# Patient Record
Sex: Male | Born: 1965 | Race: Black or African American | Hispanic: No | Marital: Single | Smoking: Never smoker
Health system: Southern US, Community
[De-identification: ages and names within clinical notes are randomized; demographics above are authoritative.]

## PROBLEM LIST (undated history)

## (undated) DIAGNOSIS — I10 Essential (primary) hypertension: Secondary | ICD-10-CM

---

## 2014-07-20 ENCOUNTER — Other Ambulatory Visit: Payer: Self-pay | Admitting: Otolaryngology

## 2014-07-20 DIAGNOSIS — Q892 Congenital malformations of other endocrine glands: Secondary | ICD-10-CM

## 2014-07-27 ENCOUNTER — Ambulatory Visit
Admission: RE | Admit: 2014-07-27 | Discharge: 2014-07-27 | Disposition: A | Discharge status: Home / Self Care | Payer: No Typology Code available for payment source | Source: Ambulatory Visit | Attending: Otolaryngology | Admitting: Otolaryngology

## 2014-07-27 ENCOUNTER — Encounter (INDEPENDENT_AMBULATORY_CARE_PROVIDER_SITE_OTHER): Payer: Self-pay

## 2014-07-27 DIAGNOSIS — Q892 Congenital malformations of other endocrine glands: Secondary | ICD-10-CM

## 2014-07-30 ENCOUNTER — Other Ambulatory Visit: Payer: Self-pay | Admitting: Otolaryngology

## 2014-07-30 DIAGNOSIS — Q892 Congenital malformations of other endocrine glands: Secondary | ICD-10-CM

## 2014-07-31 ENCOUNTER — Ambulatory Visit
Admission: RE | Admit: 2014-07-31 | Discharge: 2014-07-31 | Disposition: A | Discharge status: Home / Self Care | Payer: Self-pay | Source: Ambulatory Visit | Attending: Otolaryngology | Admitting: Otolaryngology

## 2014-07-31 DIAGNOSIS — Q892 Congenital malformations of other endocrine glands: Secondary | ICD-10-CM

## 2014-08-14 ENCOUNTER — Other Ambulatory Visit: Payer: Self-pay | Admitting: Otolaryngology

## 2014-08-15 ENCOUNTER — Inpatient Hospital Stay (HOSPITAL_COMMUNITY)
Admission: EM | Admit: 2014-08-15 | Discharge: 2014-08-17 | DRG: 206 | Disposition: A | Discharge status: Home / Self Care | Payer: No Typology Code available for payment source | Attending: Otolaryngology | Admitting: Otolaryngology

## 2014-08-15 ENCOUNTER — Encounter (HOSPITAL_COMMUNITY): Payer: Self-pay | Admitting: Emergency Medicine

## 2014-08-15 DIAGNOSIS — S1093XA Contusion of unspecified part of neck, initial encounter: Secondary | ICD-10-CM

## 2014-08-15 DIAGNOSIS — J955 Postprocedural subglottic stenosis: Secondary | ICD-10-CM | POA: Diagnosis not present

## 2014-08-15 DIAGNOSIS — I10 Essential (primary) hypertension: Secondary | ICD-10-CM | POA: Diagnosis present

## 2014-08-15 DIAGNOSIS — J384 Edema of larynx: Secondary | ICD-10-CM | POA: Diagnosis not present

## 2014-08-15 HISTORY — DX: Essential (primary) hypertension: I10

## 2014-08-15 LAB — CBC WITH DIFFERENTIAL/PLATELET
Basophils Absolute: 0 10*3/uL (ref 0.0–0.1)
Basophils Relative: 0 % (ref 0–1)
EOS ABS: 0.1 10*3/uL (ref 0.0–0.7)
Eosinophils Relative: 1 % (ref 0–5)
HCT: 38.6 % — ABNORMAL LOW (ref 39.0–52.0)
HEMOGLOBIN: 12.8 g/dL — AB (ref 13.0–17.0)
Lymphocytes Relative: 19 % (ref 12–46)
Lymphs Abs: 1.7 10*3/uL (ref 0.7–4.0)
MCH: 27.6 pg (ref 26.0–34.0)
MCHC: 33.2 g/dL (ref 30.0–36.0)
MCV: 83.2 fL (ref 78.0–100.0)
MONOS PCT: 8 % (ref 3–12)
Monocytes Absolute: 0.7 10*3/uL (ref 0.1–1.0)
NEUTROS ABS: 6.6 10*3/uL (ref 1.7–7.7)
NEUTROS PCT: 72 % (ref 43–77)
Platelets: 208 10*3/uL (ref 150–400)
RBC: 4.64 MIL/uL (ref 4.22–5.81)
RDW: 14.6 % (ref 11.5–15.5)
WBC: 9.1 10*3/uL (ref 4.0–10.5)

## 2014-08-15 MED ORDER — ONDANSETRON 4 MG PO TBDP
8.0000 mg | ORAL_TABLET | Freq: Once | ORAL | Status: AC
  Administered 2014-08-15: 8 mg via ORAL
  Filled 2014-08-15: qty 2

## 2014-08-15 MED ORDER — OXYCODONE-ACETAMINOPHEN 5-325 MG PO TABS
1.0000 | ORAL_TABLET | Freq: Once | ORAL | Status: AC
  Administered 2014-08-15: 1 via ORAL
  Filled 2014-08-15: qty 1

## 2014-08-15 NOTE — ED Notes (Signed)
Dr. Ward at bedside.

## 2014-08-15 NOTE — ED Notes (Signed)
Last  coldeine was around 1800

## 2014-08-15 NOTE — Procedures (Signed)
Preop diagnosis: Throat pain, dysphagia s/p removal of thyroglossal duct cyst Postop diagnosis: same, laryngeal edema Procedure: Transnasal fiberoptic laryngoscopy Surgeon: Jenne PaneBates Anesth: Viscous lidocaine Comp: None Findings: Tongue base position is normal.  Epiglottis is normal.  Supraglottic edema, worse on the right, with hooding of glottis.  Vocal folds normal with normal mobility. Description:  After discussing risks, benefits, and alternatives, the fiberoptic laryngoscope was passed through the nasal passage coated in viscous lidocaine.  The scope was used to evaluate the pharynx and larynx and was then removed.  He was returned to nursing care in stable condition.

## 2014-08-15 NOTE — ED Notes (Signed)
caLL PLACED  TO DR Jenne PaneBATES

## 2014-08-15 NOTE — ED Notes (Addendum)
Pt here for difficulty swallowing, unable to eat and drink after having thyroid surgery yesterday. Dr. Jenne PaneBates aware pt here and is expecting him. Pt not having pain when he is not swallowing, but rates pain 9-10/10 when he swallows. Pt also has a fever.

## 2014-08-15 NOTE — ED Provider Notes (Signed)
TIME SEEN: 9:54 PM  CHIEF COMPLAINT: Neck swelling, difficulty swallowing, fever  HPI: Patient is a 48 y.o. M with history of hypertension who recently had a thyroglossal duct cyst removed with Dr. Jearld FentonByers with ENT yesterday who presents to the emergency department with neck swelling, redness, tenderness, difficulty swallowing secondary to pain and fever. He has also had a cough but states he is not able to produce any sputum secondary to pain.  ROS: See HPI Constitutional: no fever  Eyes: no drainage  ENT: no runny nose   Cardiovascular:  no chest pain  Resp: no SOB  GI: no vomiting GU: no dysuria Integumentary: no rash  Allergy: no hives  Musculoskeletal: no leg swelling  Neurological: no slurred speech ROS otherwise negative  PAST MEDICAL HISTORY/PAST SURGICAL HISTORY:  Past Medical History  Diagnosis Date  . Hypertension     MEDICATIONS:  Prior to Admission medications   Not on File    ALLERGIES:  No Known Allergies  SOCIAL HISTORY:  History  Substance Use Topics  . Smoking status: Never Smoker   . Smokeless tobacco: Not on file  . Alcohol Use: Yes    FAMILY HISTORY: No family history on file.  EXAM: BP 157/106  Pulse 95  Temp(Src) 101.5 F (38.6 C)  Resp 18  Wt 215 lb 1 oz (97.552 kg)  SpO2 94% CONSTITUTIONAL: Alert and oriented and responds appropriately to questions. Well-appearing; well-nourished HEAD: Normocephalic EYES: Conjunctivae clear, PERRL ENT: normal nose; no rhinorrhea; moist mucous membranes; pharynx without lesions noted; airway patent, no stridor, no trismus or drooling, no dental caries NECK: Supple, no meningismus, pt has has surgical scar to the anterior neck with sutures in place with dried blood on the neck with surrounding erythema without purulent drainage with associated tenderness  CARD: RRR; S1 and S2 appreciated; no murmurs, no clicks, no rubs, no gallops RESP: Normal chest excursion without splinting or tachypnea; breath sounds  clear and equal bilaterally; no wheezes, no rhonchi, no rales, no hypoxia or respiratory distress ABD/GI: Normal bowel sounds; non-distended; soft, non-tender, no rebound, no guarding BACK:  The back appears normal and is non-tender to palpation, there is no CVA tenderness EXT: Normal ROM in all joints; non-tender to palpation; no edema; normal capillary refill; no cyanosis    SKIN: Normal color for age and race; warm; no rash NEURO: Moves all extremities equally PSYCH: The patient's mood and manner are appropriate. Grooming and personal hygiene are appropriate.  MEDICAL DECISION MAKING: Pt here with fever, pain with swallowing, coughing and erythema over surgical site.  No respiratory distress.   Pt swallowing secretions.  Dr. Jenne PaneBates to see pt in ED.  He would like to see pt prior to labs, imaging.  ED PROGRESS: 10:34 PM    Dr. Jenne PaneBates has scoped pt.  Plans to admit to stepdown.    Layla MawKristen N Zachrey Deutscher, DO 08/15/14 2248

## 2014-08-15 NOTE — ED Notes (Signed)
The pt is having difficulty  To eat and he has difficulty  Swallowing in general fromn the swelling in his throat.  No respiratory difficulty at present

## 2014-08-15 NOTE — ED Notes (Signed)
Percocet given.  Dr Derinda Latebates okd and is onhis way to check the pt

## 2014-08-15 NOTE — H&P (Signed)
Stephen Bell is an 48 y.o. male.   Chief Complaint: Dysphagia, throat pain following thyroglossal duct cyst surgery yesterday HPI: 48 year old male underwent excision of thyroglossal duct cyst yesterday and was discharged with drain in place.   This morning, he felt more swelling of the neck with pain.  This has worsened through the day and he now cannot swallow very well.  Pain has worsened.  He had no fever until arrival.  He denies breathing difficulty.  Past Medical History  Diagnosis Date  . Hypertension     History reviewed. No pertinent past surgical history.  No family history on file. Social History:  reports that he has never smoked. He does not have any smokeless tobacco history on file. He reports that he drinks alcohol. His drug history is not on file.  Allergies: No Known Allergies   (Not in a hospital admission)  No results found for this or any previous visit (from the past 48 hour(s)). No results found.  Review of Systems  Constitutional: Positive for fever.  HENT: Positive for sore throat.   All other systems reviewed and are negative.   Blood pressure 157/106, pulse 95, temperature 101.5 F (38.6 C), resp. rate 18, weight 97.552 kg (215 lb 1 oz), SpO2 94.00%. Physical Exam  Constitutional: He is oriented to person, place, and time. He appears well-developed and well-nourished. No distress.  HENT:  Head: Normocephalic and atraumatic.  Right Ear: External ear normal.  Left Ear: External ear normal.  Nose: Nose normal.  Mouth/Throat: Oropharynx is clear and moist.  Voice mildly hoarse, low-pitched.  Eyes: Conjunctivae and EOM are normal. Pupils are equal, round, and reactive to light.  Neck: Normal range of motion. Neck supple.  Neck incision clean and intact.  Drain in place.  Neck surgical area with some edema and firmness, fluid collection not clearly present.  Cardiovascular: Normal rate.   Respiratory: Effort normal.  Musculoskeletal: Normal range  of motion.  Neurological: He is alert and oriented to person, place, and time. No cranial nerve deficit.  Skin: Skin is warm and dry.  Psychiatric: He has a normal mood and affect. His behavior is normal. Judgment and thought content normal.     Assessment/Plan Laryngeal edema, throat pain, fever following thyroglossal duct surgery yesterday Fiberoptic exam of the larynx was performed in ER demonstrating right greater than left supraglottic edema.  Neck exam does not clearly demonstrate fluid collection.  Will admit for airway observation and treatment with IV antibiotics and steroids.  Will reexamine with fiberoptic scope tomorrow morning.  Will leave drain in place for now.  NPO.  Mikaelyn Arthurs 08/15/2014, 10:19 PM

## 2014-08-15 NOTE — ED Notes (Signed)
The npt has had surgery at the [facility] yesterday to remove a thyroglossal duct cyst.  Today increased pain  And swelling with a temp.. Dr Prince Romebvates to meet the pt here

## 2014-08-16 ENCOUNTER — Inpatient Hospital Stay (HOSPITAL_COMMUNITY): Payer: No Typology Code available for payment source

## 2014-08-16 DIAGNOSIS — J955 Postprocedural subglottic stenosis: Secondary | ICD-10-CM | POA: Diagnosis present

## 2014-08-16 DIAGNOSIS — J384 Edema of larynx: Secondary | ICD-10-CM | POA: Diagnosis present

## 2014-08-16 DIAGNOSIS — I10 Essential (primary) hypertension: Secondary | ICD-10-CM | POA: Diagnosis present

## 2014-08-16 LAB — MRSA PCR SCREENING: MRSA by PCR: NEGATIVE

## 2014-08-16 MED ORDER — SODIUM CHLORIDE 0.9 % IV SOLN: 3.0000 g | Freq: Four times a day (QID) | INTRAVENOUS | Status: DC

## 2014-08-16 MED ORDER — BACITRACIN ZINC 500 UNIT/GM EX OINT
1.0000 "application " | TOPICAL_OINTMENT | Freq: Three times a day (TID) | CUTANEOUS | Status: DC
  Administered 2014-08-16 – 2014-08-17 (×4): 1 via TOPICAL
  Filled 2014-08-16: qty 15
  Filled 2014-08-16: qty 28.35

## 2014-08-16 MED ORDER — PROMETHAZINE HCL 25 MG PO TABS
12.5000 mg | ORAL_TABLET | Freq: Four times a day (QID) | ORAL | Status: DC | PRN
  Filled 2014-08-16: qty 1

## 2014-08-16 MED ORDER — BENAZEPRIL HCL 20 MG PO TABS
20.0000 mg | ORAL_TABLET | Freq: Every day | ORAL | Status: DC
  Administered 2014-08-16 – 2014-08-17 (×2): 20 mg via ORAL
  Filled 2014-08-16 (×2): qty 1

## 2014-08-16 MED ORDER — MORPHINE SULFATE 2 MG/ML IJ SOLN: 2.0000 mg | INTRAMUSCULAR | Status: DC | PRN

## 2014-08-16 MED ORDER — PROMETHAZINE HCL 25 MG RE SUPP
12.5000 mg | Freq: Four times a day (QID) | RECTAL | Status: DC | PRN
  Filled 2014-08-16: qty 1

## 2014-08-16 MED ORDER — AMLODIPINE BESYLATE 10 MG PO TABS
10.0000 mg | ORAL_TABLET | Freq: Every day | ORAL | Status: DC
  Administered 2014-08-16 – 2014-08-17 (×2): 10 mg via ORAL
  Filled 2014-08-16: qty 1
  Filled 2014-08-16: qty 2
  Filled 2014-08-16: qty 1

## 2014-08-16 MED ORDER — SODIUM CHLORIDE 0.9 % IV SOLN
3.0000 g | Freq: Four times a day (QID) | INTRAVENOUS | Status: AC
  Administered 2014-08-16 (×4): 3 g via INTRAVENOUS
  Filled 2014-08-16 (×3): qty 3

## 2014-08-16 MED ORDER — CLONIDINE HCL 0.1 MG PO TABS
0.1000 mg | ORAL_TABLET | Freq: Once | ORAL | Status: AC
  Administered 2014-08-16: 0.1 mg via ORAL
  Filled 2014-08-16: qty 1

## 2014-08-16 MED ORDER — KCL IN DEXTROSE-NACL 20-5-0.45 MEQ/L-%-% IV SOLN
INTRAVENOUS | Status: DC
  Administered 2014-08-16 – 2014-08-17 (×4): via INTRAVENOUS
  Filled 2014-08-16 (×6): qty 1000

## 2014-08-16 MED ORDER — DEXAMETHASONE SODIUM PHOSPHATE 10 MG/ML IJ SOLN
20.0000 mg | INTRAMUSCULAR | Status: DC
  Administered 2014-08-16 – 2014-08-17 (×2): 20 mg via INTRAVENOUS
  Filled 2014-08-16 (×3): qty 2

## 2014-08-16 MED ORDER — HYDROCODONE-ACETAMINOPHEN 7.5-325 MG/15ML PO SOLN: 10.0000 mL | ORAL | Status: DC | PRN

## 2014-08-16 NOTE — Progress Notes (Signed)
Utilization review completed.  

## 2014-08-16 NOTE — Procedures (Signed)
Preop diagnosis: Laryngeal edema, throat pain, dysphagia s/p removal of thyroglossal duct cyst Postop diagnosis: same Procedure: Transnasal fiberoptic laryngoscopy Surgeon: Jenne PaneBates Anesth: None Comp: None Findings: Tongue base position is normal. Epiglottis is normal. Supraglottic edema, worse on the right, with hooding of glottis, a bit worse than last night. Vocal folds normal with normal mobility. Description: After discussing risks, benefits, and alternatives, the fiberoptic laryngoscope was passed through the nasal passage. The scope was used to evaluate the pharynx and larynx and was then removed. He was returned to nursing care in stable condition.

## 2014-08-16 NOTE — Progress Notes (Signed)
  Subjective: Reports improved throat pain and dysphagia this morning.  No problems overnight.  Objective: Vital signs in last 24 hours: Temp:  [99.1 F (37.3 C)-101.5 F (38.6 C)] 99.1 F (37.3 C) (11/01 0700) Pulse Rate:  [72-97] 87 (11/01 0700) Resp:  [14-25] 18 (11/01 0700) BP: (108-172)/(69-106) 160/103 mmHg (11/01 0700) SpO2:  [86 %-97 %] 93 % (11/01 0700) Weight:  [96.1 kg (211 lb 13.8 oz)-97.552 kg (215 lb 1 oz)] 96.1 kg (211 lb 13.8 oz) (11/01 0700)    Intake/Output from previous day: 10/31 0701 - 11/01 0700 In: 825 [I.V.:625; IV Piggyback:200] Out: 375 [Urine:375] Intake/Output this shift:    General appearance: alert, cooperative, no distress and voice mildly hoarse and rumbly. Neck: neck incision clean and intact, drain in place, surgical area with general firm edema  Lab Results:   Recent Labs  08/15/14 2245  WBC 9.1  HGB 12.8*  HCT 38.6*  PLT 208   BMET No results for input(s): NA, K, CL, CO2, GLUCOSE, BUN, CREATININE, CALCIUM in the last 72 hours. PT/INR No results for input(s): LABPROT, INR in the last 72 hours. ABG No results for input(s): PHART, HCO3 in the last 72 hours.  Invalid input(s): PCO2, PO2  Studies/Results: No results found.  Anti-infectives: Anti-infectives    Start     Dose/Rate Route Frequency Ordered Stop   08/16/14 0030  Ampicillin-Sulbactam (UNASYN) 3 g in sodium chloride 0.9 % 100 mL IVPB     3 g100 mL/hr over 60 Minutes Intravenous 4 times per day 08/16/14 0004 08/16/14 2359   08/16/14 0003  Ampicillin-Sulbactam (UNASYN) 3 g in sodium chloride 0.9 % 100 mL IVPB  Status:  Discontinued     3 g100 mL/hr over 60 Minutes Intravenous Every 6 hours 08/16/14 0003 08/16/14 0004      Assessment/Plan: Laryngeal edema, dysphagia, throat pain s/p thyroglossal duct cyst excision Repeat fiberoptic exam of the larynx shows no improvement in laryngeal edema.  Neck remains full.  Will get ultrasound to see if hematoma may be present.   Continue IV Unasyn and Decadron.  NPO until after ultrasound.  Potential need to clean out hematoma.  LOS: 1 day    Stephen Bell 08/16/2014

## 2014-08-17 LAB — GLUCOSE, CAPILLARY: Glucose-Capillary: 140 mg/dL — ABNORMAL HIGH (ref 70–99)

## 2014-08-17 NOTE — Progress Notes (Signed)
Tiana LoftHenderson Suits to be D/C'd Home per MD order.  Discussed with the patient and all questions fully answered.  VSS, Skin clean, dry and intact without evidence of skin break down, no evidence of skin tears noted.  Drain pulled by Dr. Jearld FentonByers this morning. Site clean, intact. IV catheter discontinued intact. Site without signs and symptoms of complications. Dressing and pressure applied.  An After Visit Summary was printed and given to the patient. Per Dr. Jearld FentonByers, patient would go home with Clindamycin antibiotic and he called in to patient's pharmacy.  Dr. Jearld FentonByers nurse, Rivka BarbaraGlenda, was notified because patient's pharmacy never got Clindamycin prescription.  Per Rivka BarbaraGlenda, she would re-submit the prescription.  Patient instructed to follow up with their office if any other issues arose.   D/c education completed with patient/family including follow up instructions, medication list, d/c activities limitations if indicated, with other d/c instructions as indicated by MD - patient able to verbalize understanding, all questions fully answered.   Patient instructed to return to ED, call 911, or call MD for any changes in condition.   Patient escorted via WC, and D/C home via private auto.  Burt EkCook, Jeremih Dearmas D 08/17/2014 8:57 AM

## 2014-08-17 NOTE — Care Management Note (Signed)
  Page 1 of 1   08/17/2014     10:42:38 AM CARE MANAGEMENT NOTE 08/17/2014  Patient:  Stephen Bell,Stephen Bell   Account Number:  [id number]  Date Initiated:  08/17/2014  Documentation initiated by:  Ronny FlurryWILE,Jaemarie Hochberg  Subjective/Objective Assessment:     Action/Plan:   Anticipated DC Date:  08/17/2014   Anticipated DC Plan:           Choice offered to / List presented to:             Status of service:   Medicare Important Message given?  YES (If response is "NO", the following Medicare IM given date fields will be blank) Date Medicare IM given:  08/17/2014 Medicare IM given by:  Ronny FlurryWILE,Ruvi Fullenwider Date Additional Medicare IM given:   Additional Medicare IM given by:    Discharge Disposition:    Per UR Regulation:    If discussed at Long Length of Stay Meetings, dates discussed:    Comments:

## 2014-08-17 NOTE — Progress Notes (Signed)
  Subjective: He feels back to normal. No breathing issues. Swallowing well. No further pain.   Objective: Vital signs in last 24 hours: Temp:  [97.6 F (36.4 C)-98.5 F (36.9 C)] 97.7 F (36.5 C) (11/02 0551) Pulse Rate:  [64-95] 64 (11/02 0551) Resp:  [14-20] 18 (11/02 0551) BP: (137-183)/(81-119) 138/89 mmHg (11/02 0551) SpO2:  [91 %-95 %] 95 % (11/02 0551) Last BM Date: 08/14/14  Intake/Output from previous day: 11/01 0701 - 11/02 0700 In: 900 [I.V.:800; IV Piggyback:100] Out: 460 [Urine:450; Drains:10] Intake/Output this shift:    he is awake and alert. nl voice. no breathing problems and swallowing well. Neck- normal amount of swelling. not tender.  drain removed.   Lab Results:   Recent Labs  08/15/14 2245  WBC 9.1  HGB 12.8*  HCT 38.6*  PLT 208   BMET No results for input(s): NA, K, CL, CO2, GLUCOSE, BUN, CREATININE, CALCIUM in the last 72 hours. PT/INR No results for input(s): LABPROT, INR in the last 72 hours. ABG No results for input(s): PHART, HCO3 in the last 72 hours.  Invalid input(s): PCO2, PO2  Studies/Results: [place] Soft Tissue Head/neck  08/16/2014   CLINICAL DATA:  Removal of thyroglossal duct cyst 08/14/2014. Fever, swelling, difficulty swallowing.  EXAM: ULTRASOUND OF HEAD/NECK SOFT TISSUES  TECHNIQUE: Ultrasound examination of the head and neck soft tissues was performed in the area of clinical concern.  COMPARISON:  07/27/2014 .  FINDINGS: Noted in the region of the incision is a hypoechoic area. This could represent edema, seroma/ hematoma, or phlegmon. No clearcut walled-off fluid collection to suggest abscess is noted . If symptoms persist contrast-enhanced CT of the neck can be obtained .  IMPRESSION: Hypoechoic region in the area of incision. This could represent edema, seroma/ hematoma, or phlegmon. No clearcut walled-off fluid collection to suggest abscess noted. If symptoms persist contrast CT of the neck can be obtained.   Electronically  Signed   By: Maisie Fushomas  Register   On: 08/16/2014 15:05    Anti-infectives: Anti-infectives    Start     Dose/Rate Route Frequency Ordered Stop   08/16/14 0030  Ampicillin-Sulbactam (UNASYN) 3 g in sodium chloride 0.9 % 100 mL IVPB     3 g100 mL/hr over 60 Minutes Intravenous 4 times per day 08/16/14 0004 08/16/14 1832   08/16/14 0003  Ampicillin-Sulbactam (UNASYN) 3 g in sodium chloride 0.9 % 100 mL IVPB  Status:  Discontinued     3 g100 mL/hr over 60 Minutes Intravenous Every 6 hours 08/16/14 0003 08/16/14 0004      Assessment/Plan: s/p * No surgery found * Discharge  LOS: 2 days    Suzanna ObeyBYERS, Jezreel Sisk 08/17/2014

## 2014-08-17 NOTE — Discharge Summary (Signed)
Physician Discharge Summary  Patient ID: Stephen Bell MRN: [id number] DOB/AGE: Oct 20, 1965 48 y.o.  Admit date: 08/15/2014 Discharge date: 08/17/2014  Admission Diagnoses:thyroglossal duct cyst postop swelling  Discharge Diagnoses: same Active Problems:   Larynx edema   Discharged Condition: good  Hospital Course: patient underwent a thyroglossal duct excision on Friday and developed swelling on Saturday. Apparently he had a fever increased pain and difficulty swallowing. He had noted swelling in his anterior neck. The drain apparently was not putting out significant amount of drainage. He was admitted. An ultrasound was performed which did not show obvious collection of hematoma. He was placed on Unasyn. He had supraglottic swelling by fiberoptic exam. His voice is slightly raspy. Today his fever is resolved. He no longer has dysphagia. His swelling in his neck has resolved. He has no pain of significance. His voice is back to normal. No breathing problems. He is ready to go home.we discussed the issue with wound infection which seems unlikely with his timeframe after the surgery. The fever likely was atelectasis and not the wound. We talked about this but just to be on the safe side he prefers to go home on antibiotics which clindamycin was called in. He will follow-up on Friday sooner if any recurrence or new issues arise. Consults: None  Significant Diagnostic Studies: none  Treatments: surgery: none  Discharge Exam: Blood pressure 138/89, pulse 64, temperature 97.7 F (36.5 C), temperature source Oral, resp. rate 18, height 5\' 3"  (1.6 m), weight 96.1 kg (211 lb 13.8 oz), SpO2 95 %. he is awake and alert. He has a normal voice. He is in good spirits. He does not have any breathing problems. Oral cavity/oropharynx-no lesions or swelling. Neck without swelling of significance. There is just some very mild swelling around the incision site. The drain  was removed. Lungs--clear.  CV-regular rate and rhythm. Extremities no tenderness or swelling.  Abdomen without tenderness  Disposition: Final discharge disposition not confirmed     Medication List    ASK your doctor about these medications        amLODipine-benazepril 10-20 MG per capsule  Commonly known as:  LOTREL  Take 1 capsule by mouth daily.     HYDROcodone-acetaminophen 5-325 MG per tablet  Commonly known as:  NORCO/VICODIN  Take 1 tablet by mouth every 4 (four) hours as needed for moderate pain.     ibuprofen 200 MG tablet  Commonly known as:  ADVIL,MOTRIN  Take 200 mg by mouth every 6 (six) hours as needed.         SignedSuzanna Obey: Stephen Bell 08/17/2014, 8:03 AM

## 2016-07-06 IMAGING — US US SOFT TISSUE HEAD/NECK
1 series · 14 of 25 positions shown · non-contrast
Comparison: 07/27/2014 .

CLINICAL DATA: Removal of thyroglossal duct cyst 08/14/2014. Fever,
swelling, difficulty swallowing.

EXAM:
ULTRASOUND OF HEAD/NECK SOFT TISSUES
TECHNIQUE: Ultrasound examination of the head and neck soft tissues was
performed in the area of clinical concern.

[Series 1: us soft tissue head/neck · 0.07mm/px · 33 acquisitions, 14 frames shown]
[im 1/33]
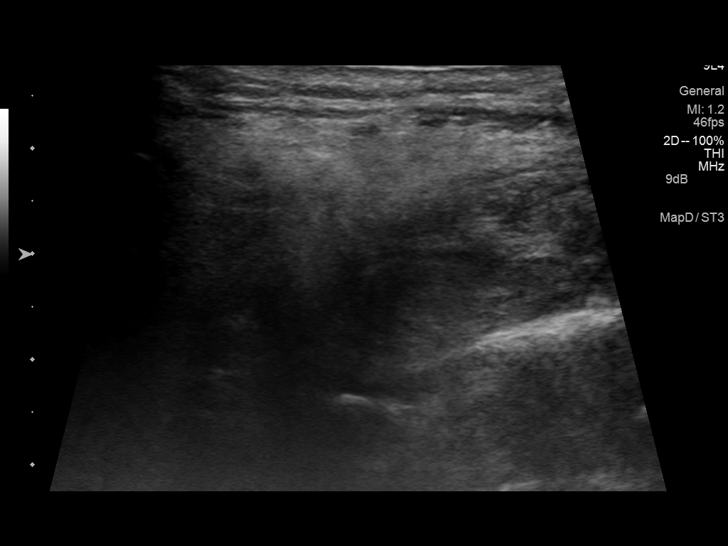
[im 3/33]
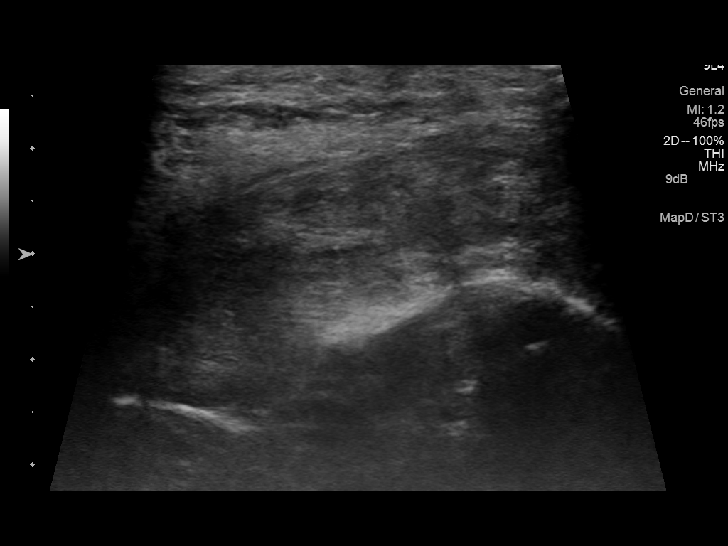
[im 6/33]
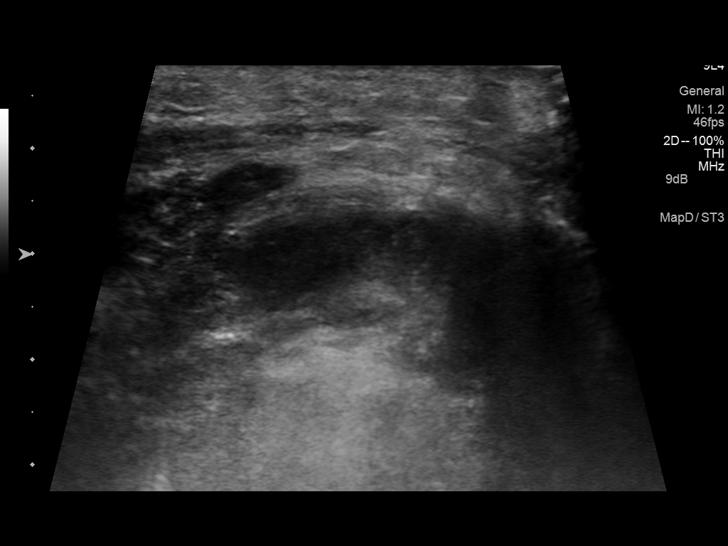
[im 9/33]
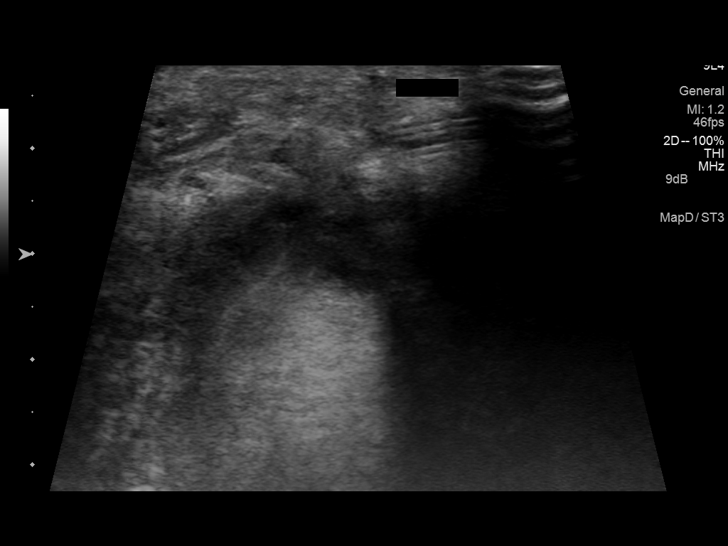
[im 11/33]
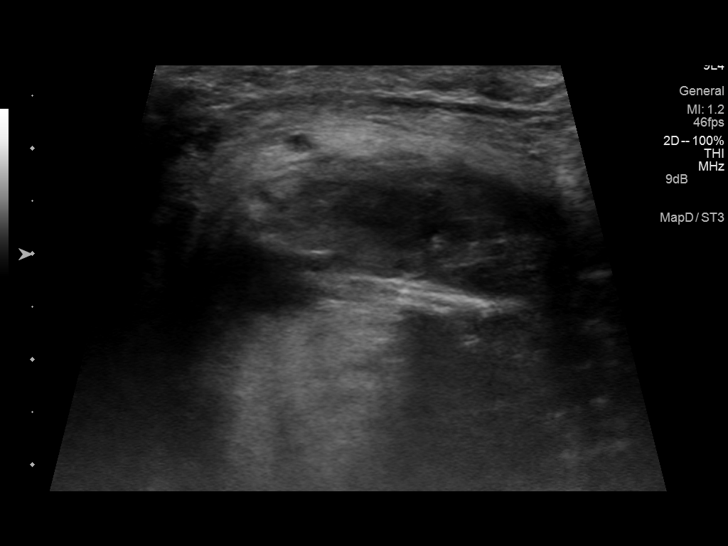
[im 13/33]
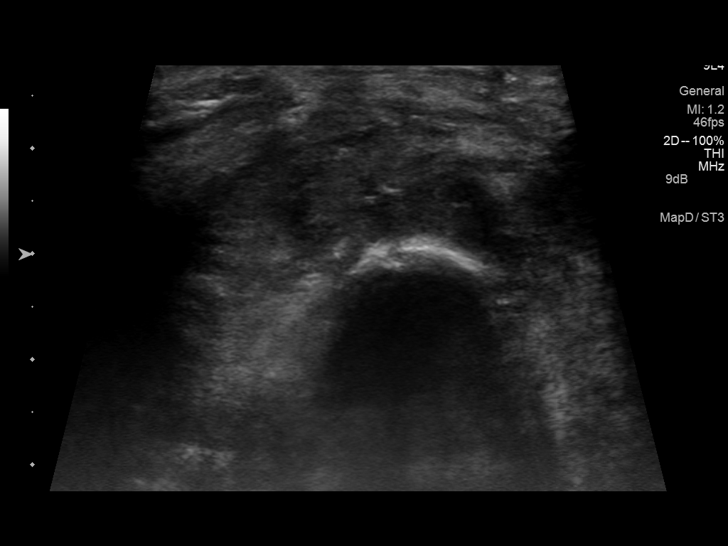
[im 15/33]
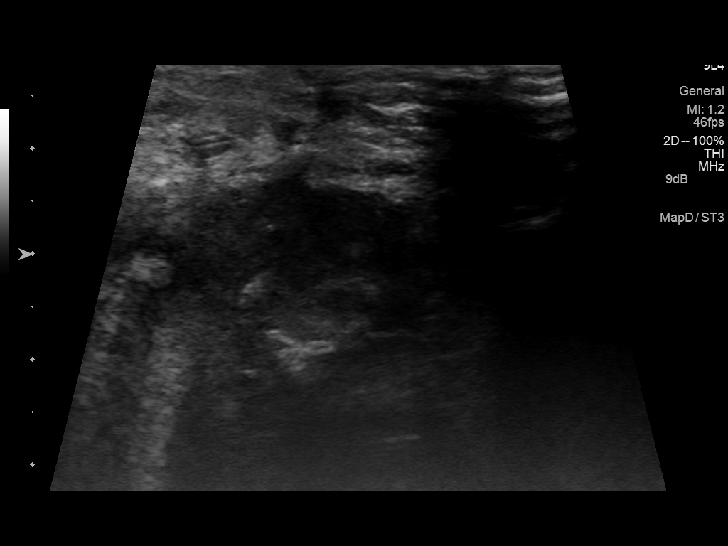
[im 18/33]
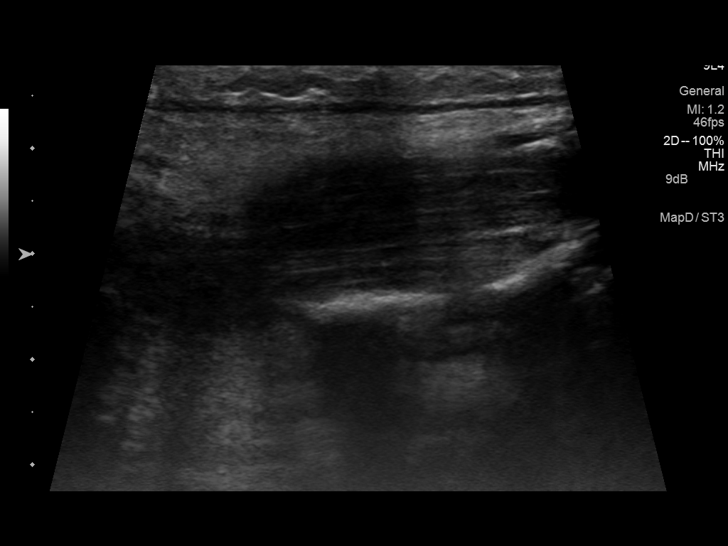
[im 21/33]
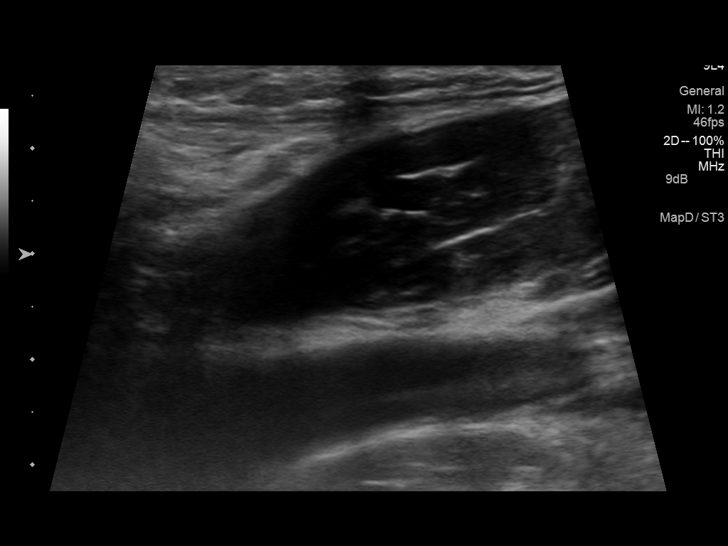
[im 22/33]
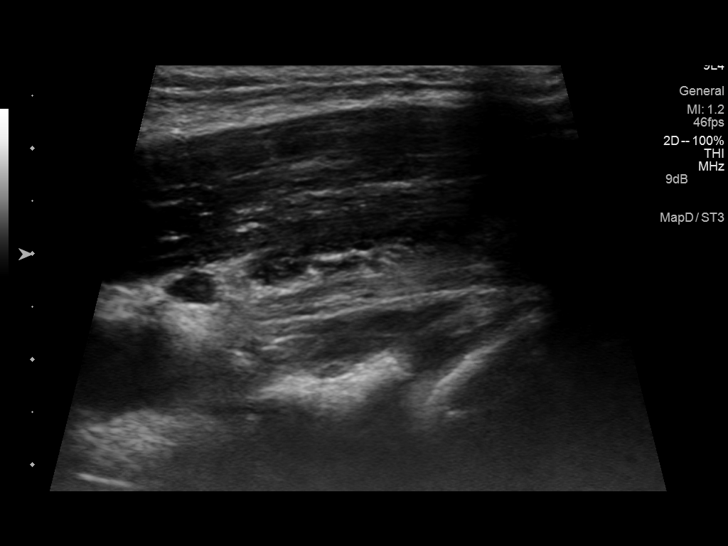
[im 25/33]
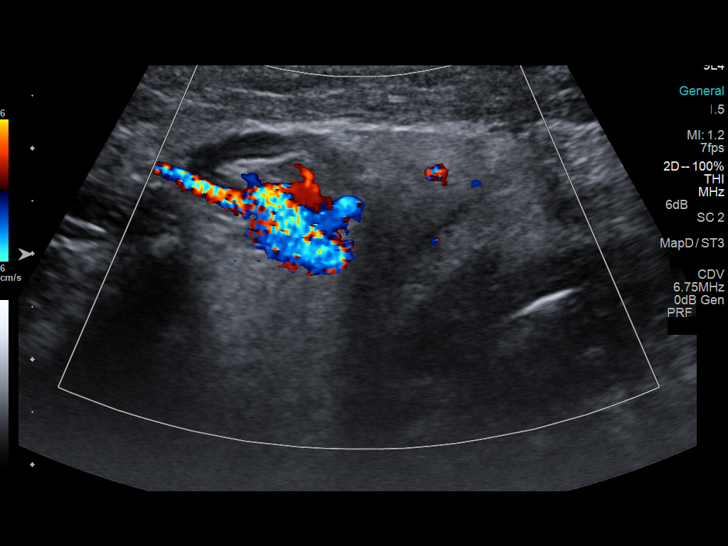
[im 27/33]
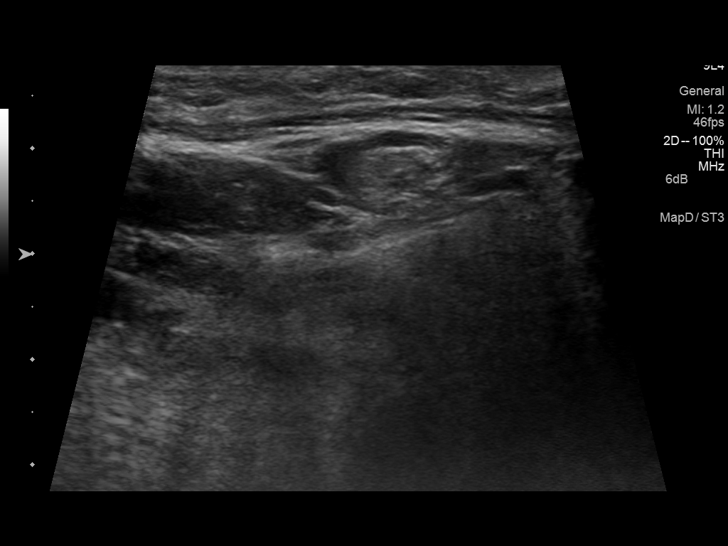
[im 30/33]
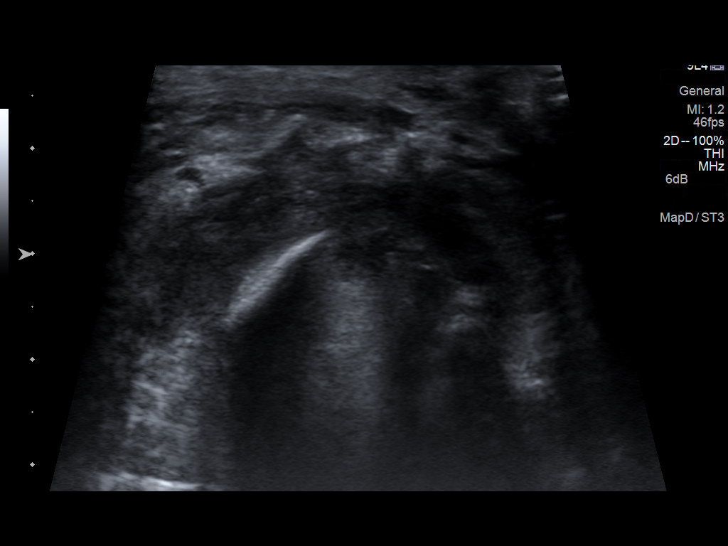
[im 33/33]
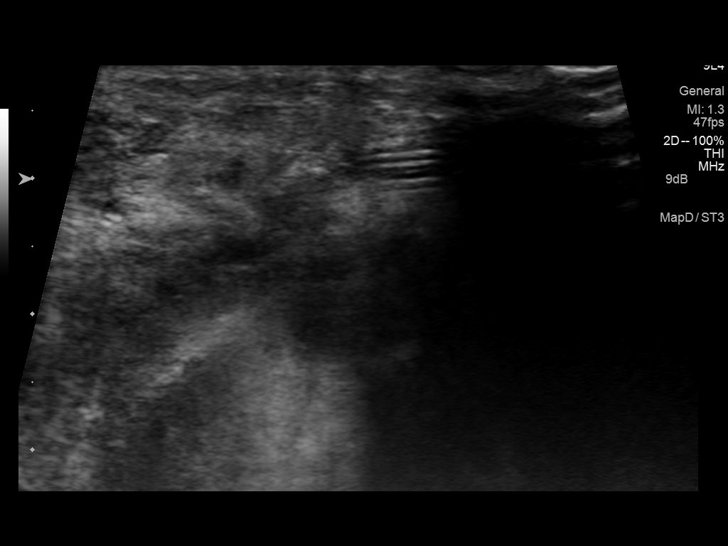

[14 of 25 positions shown; findings below may reference images not displayed]

FINDINGS: Noted in the region of the incision is a hypoechoic area. This could
represent edema, seroma/ hematoma, or phlegmon. No clearcut
walled-off fluid collection to suggest abscess is noted . If
symptoms persist contrast-enhanced CT of the neck can be obtained .
IMPRESSION: Hypoechoic region in the area of incision. This could represent
edema, seroma/ hematoma, or phlegmon. No clearcut walled-off fluid
collection to suggest abscess noted. If symptoms persist contrast CT
of the neck can be obtained.
